# Patient Record
Sex: Female | Born: 1970 | Race: White | Hispanic: No | Marital: Married | Smoking: Current every day smoker
Health system: Southern US, Community
[De-identification: ages and names within clinical notes are randomized; demographics above are authoritative.]

## PROBLEM LIST (undated history)

## (undated) DIAGNOSIS — E785 Hyperlipidemia, unspecified: Secondary | ICD-10-CM

## (undated) DIAGNOSIS — T7840XA Allergy, unspecified, initial encounter: Secondary | ICD-10-CM

## (undated) DIAGNOSIS — K219 Gastro-esophageal reflux disease without esophagitis: Secondary | ICD-10-CM

## (undated) HISTORY — DX: Hyperlipidemia, unspecified: E78.5

## (undated) HISTORY — DX: Gastro-esophageal reflux disease without esophagitis: K21.9

## (undated) HISTORY — DX: Allergy, unspecified, initial encounter: T78.40XA

## (undated) HISTORY — PX: CHOLECYSTECTOMY: SHX55

---

## 1999-09-24 ENCOUNTER — Other Ambulatory Visit: Admission: RE | Admit: 1999-09-24 | Discharge: 1999-09-24 | Payer: Self-pay | Admitting: Obstetrics and Gynecology

## 2000-09-19 ENCOUNTER — Other Ambulatory Visit: Admission: RE | Admit: 2000-09-19 | Discharge: 2000-09-19 | Payer: Self-pay | Admitting: Obstetrics and Gynecology

## 2000-10-14 ENCOUNTER — Observation Stay (HOSPITAL_COMMUNITY): Admission: AD | Admit: 2000-10-14 | Discharge: 2000-10-15 | Payer: Self-pay | Admitting: Obstetrics and Gynecology

## 2002-02-07 ENCOUNTER — Other Ambulatory Visit: Admission: RE | Admit: 2002-02-07 | Discharge: 2002-02-07 | Payer: Self-pay | Admitting: Obstetrics and Gynecology

## 2003-02-13 ENCOUNTER — Other Ambulatory Visit: Admission: RE | Admit: 2003-02-13 | Discharge: 2003-02-13 | Payer: Self-pay | Admitting: Obstetrics and Gynecology

## 2004-03-18 ENCOUNTER — Other Ambulatory Visit: Admission: RE | Admit: 2004-03-18 | Discharge: 2004-03-18 | Payer: Self-pay | Admitting: Obstetrics and Gynecology

## 2009-12-09 ENCOUNTER — Emergency Department (HOSPITAL_COMMUNITY): Admission: EM | Admit: 2009-12-09 | Discharge: 2009-12-09 | Payer: Self-pay | Admitting: Emergency Medicine

## 2009-12-09 IMAGING — CR DG CHEST 2V
2 series · 2 of 2 positions shown · non-contrast
Comparison: None.

CLINICAL DATA: Chest pain on the left.

CHEST - 2 VIEW

[w chest pa]
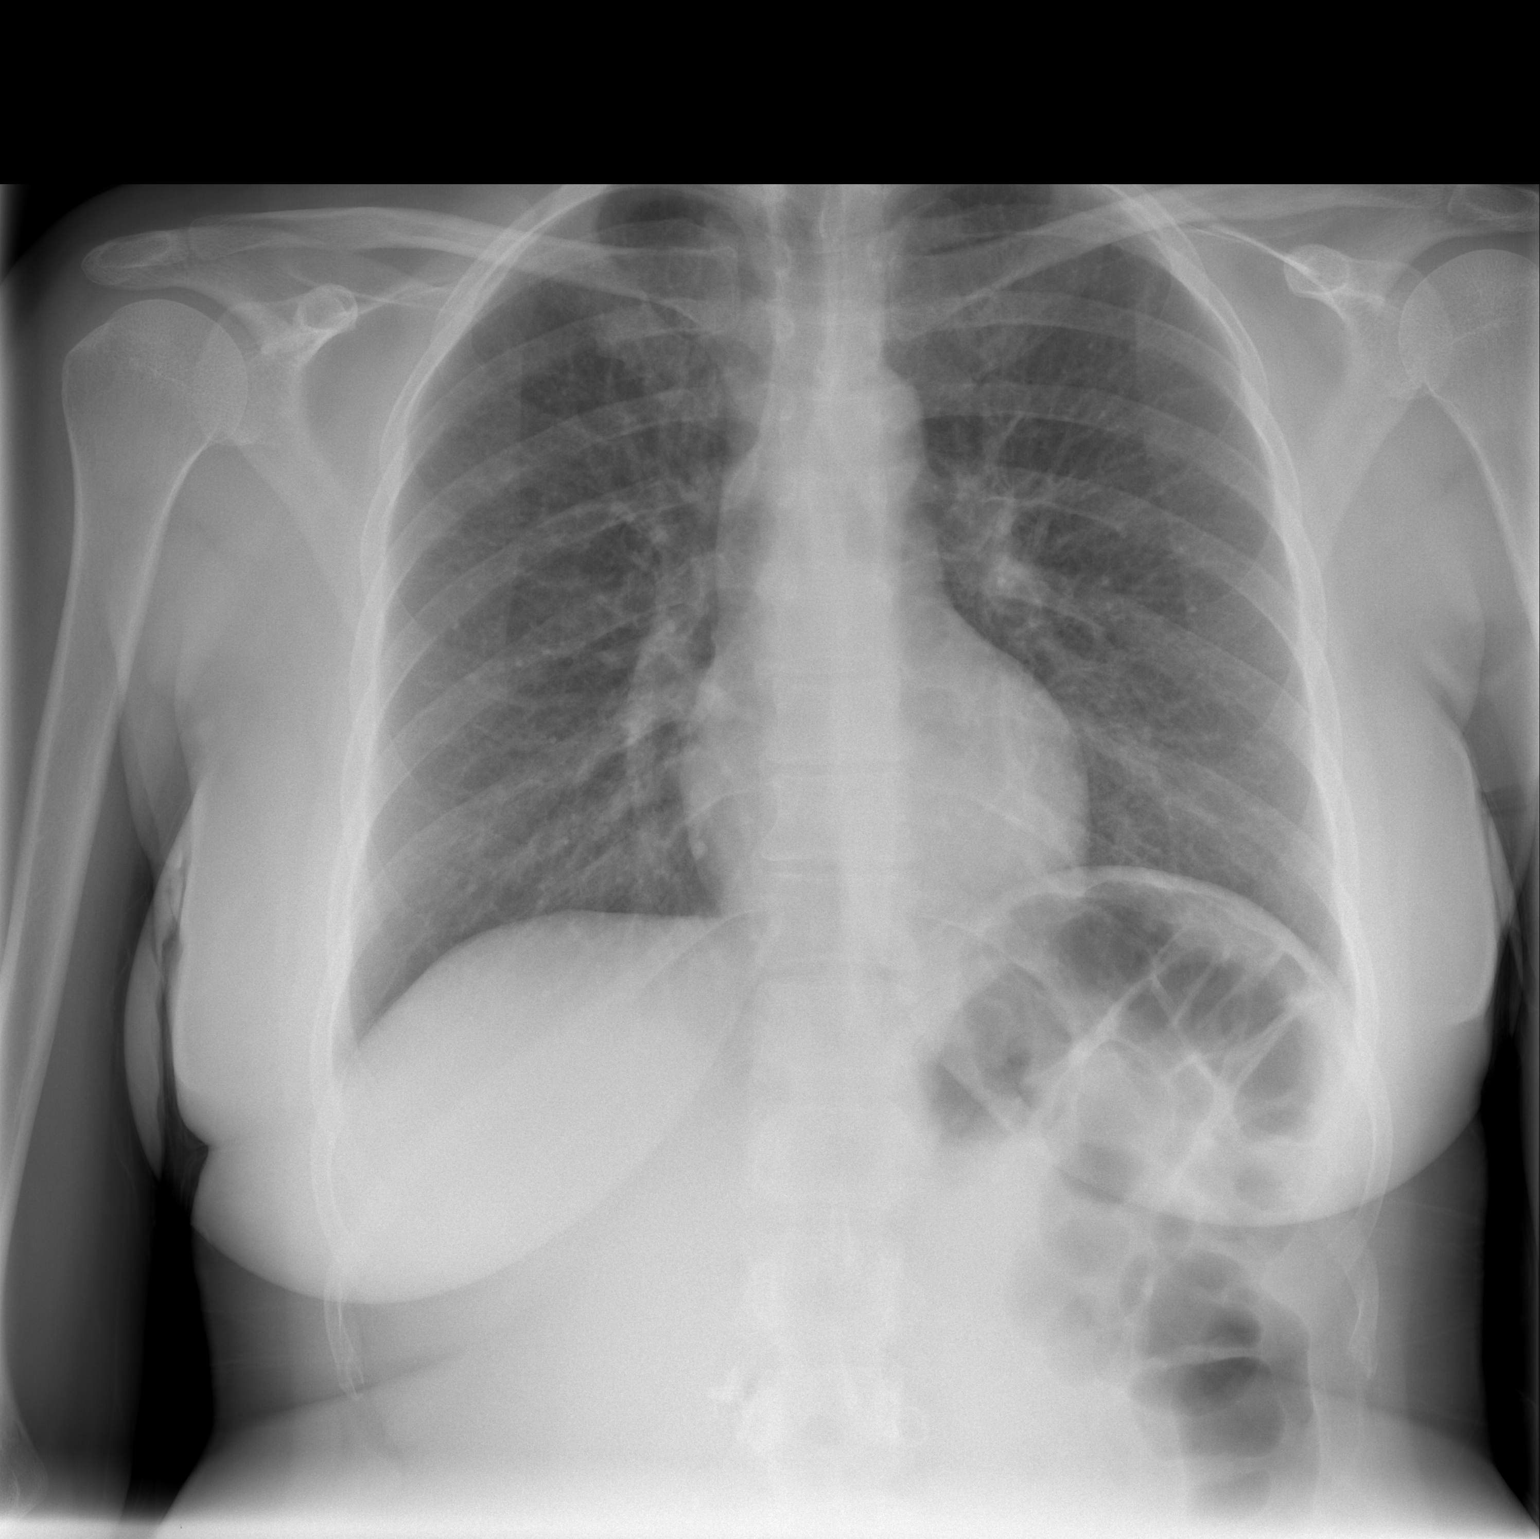

[w chest lat]
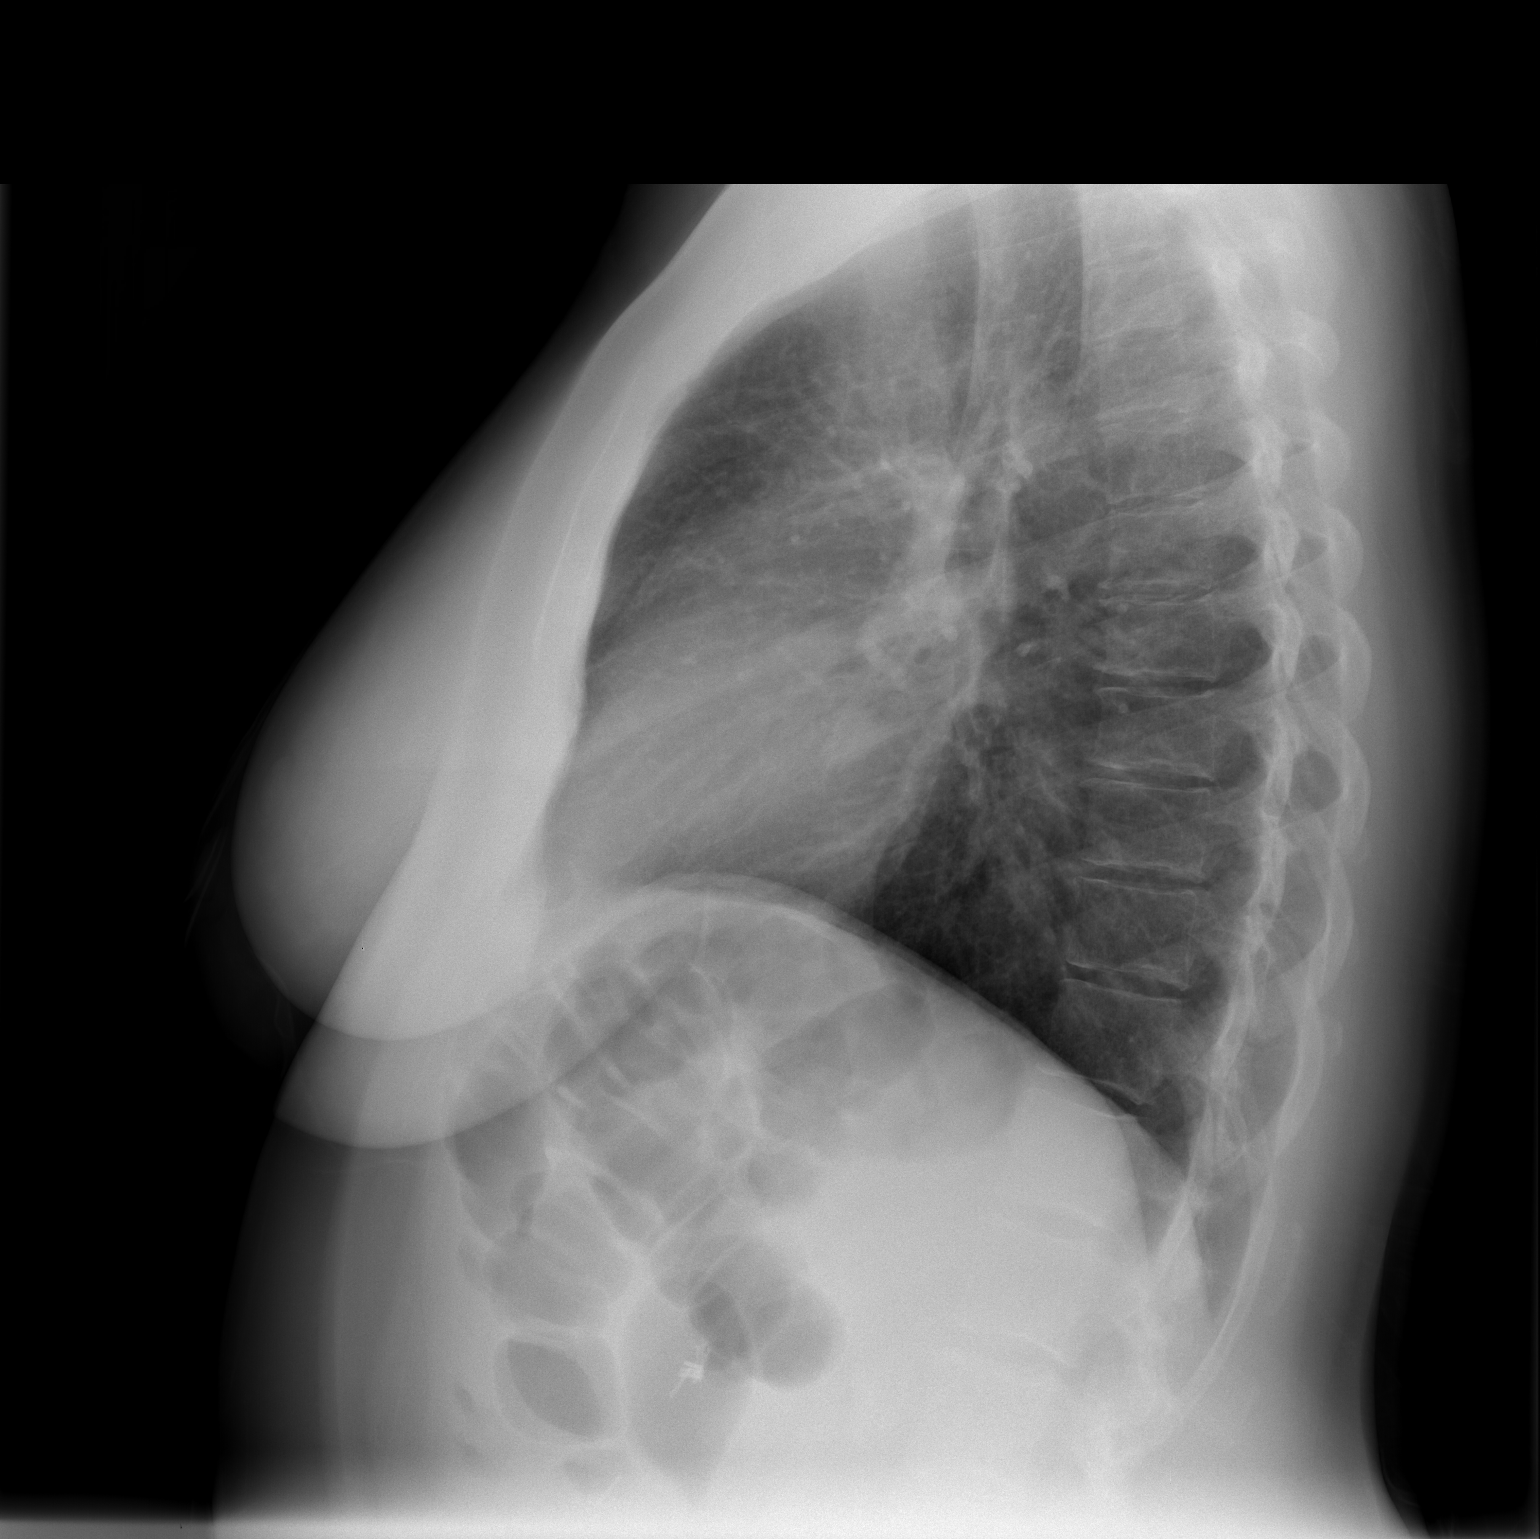

[2 of 2 positions shown; findings below may reference images not displayed]

FINDINGS: Cardiac and mediastinal contours appear normal.

The lungs appear clear.

No pleural effusion is identified.
IMPRESSION: No significant abnormality identified.

## 2011-01-04 LAB — URINALYSIS, ROUTINE W REFLEX MICROSCOPIC
Bilirubin Urine: NEGATIVE
Ketones, ur: NEGATIVE mg/dL
Specific Gravity, Urine: 1.02 (ref 1.005–1.030)
Urobilinogen, UA: 0.2 mg/dL (ref 0.0–1.0)

## 2011-01-04 LAB — POCT I-STAT, CHEM 8
BUN: 5 mg/dL — ABNORMAL LOW (ref 6–23)
Calcium, Ion: 1.11 mmol/L — ABNORMAL LOW (ref 1.12–1.32)
Chloride: 104 mEq/L (ref 96–112)
Glucose, Bld: 204 mg/dL — ABNORMAL HIGH (ref 70–99)
Potassium: 3.7 mEq/L (ref 3.5–5.1)

## 2011-01-04 LAB — PREGNANCY, URINE: Preg Test, Ur: NEGATIVE

## 2011-01-04 LAB — POCT CARDIAC MARKERS
CKMB, poc: <1 ng/mL — ABNORMAL LOW (ref 1.0–8.0)
Troponin i, poc: <0.05 ng/mL (ref 0.00–0.09)

## 2011-01-04 LAB — URINE MICROSCOPIC-ADD ON

## 2011-06-24 ENCOUNTER — Encounter: Payer: Self-pay | Admitting: *Deleted

## 2011-06-24 ENCOUNTER — Encounter: Payer: 59 | Attending: Family Medicine | Admitting: *Deleted

## 2011-06-24 DIAGNOSIS — E119 Type 2 diabetes mellitus without complications: Secondary | ICD-10-CM | POA: Insufficient documentation

## 2011-06-24 DIAGNOSIS — Z713 Dietary counseling and surveillance: Secondary | ICD-10-CM | POA: Insufficient documentation

## 2011-06-24 NOTE — Progress Notes (Signed)
  Medical Nutrition Therapy:  Appt start time: 0900 end time:  1000.   Assessment:  Primary concerns today: diabetes mangement. She and her husband report today for nutrition counseling regarding new onset type II DM. She reports a strong family hx for DM and a hx of GDM 19 years ago. Pt reports that she is very fearful of DM side effects as her father suffered from almost all possible DM complications. She is fearful today but notes that she needs more information on how to care for herself better. She has a referral for a endocrinologist and hopes to go soon.  MEDICATIONS: See updated medication list. Pt only on Januvia for DM.   DIETARY INTAKE:  Usual eating pattern includes 3 meals and 2 snacks per day.  24-hr recall:  B (8:30-9 AM): Honey nut cheerios OR Special K cereal w/ milk  L (11:30-1:30 PM): Tuna sandwich OR [employer]-fil[employer]a Wrap w/ fries Snk (3:30-5 PM): pc. fresh fruit OR peanut butter crackers OR carrots w/ ranch D (7-9 PM): Grilled chicken, grilled vegetables, brown rice OR Steak, baked potato, salad, lt. dressing Snk (9-11 PM): peanut butter crackers OR ice cream OR cheese stick w/ triscuits OR SF cookies Beverages: Mountain Dew (80 oz), Diet Mountain Dew, Diet Coke, or Water w/ Crystal Light  Usual physical activity: Very limited at this time due to stress and limited time w/ children/work.  Estimated energy needs: 1400-1600 calories 180 g carbohydrates 90-100 g protein 55 g fat  Last HgbA1c Level =  ~10% CBG Monitoring = Pt has meter (Accucheck Nano) CBG rates = FBS: 200's, After meals: 240  Progress Towards Goal(s):  In progress.   Nutritional Diagnosis:  [place]-2.1 Inpaired nutrition utilization As related to recent Type 2 DM onset.  As evidenced by A1C levels >10%.    Intervention:  Nutrition education.  Handouts given during visit include:  Living Well with Diabetes: [employer]  ADA's "What's My Number"    Monitoring/Evaluation:  Dietary intake, exercise,  glucose levels, and body weight prn.

## 2011-06-24 NOTE — Patient Instructions (Addendum)
Goals:  Eat 3 meals and 2 snacks, every 3-5 hrs  Limit carbohydrate intake to 30-45 grams carbohydrate/meal  Limit carbohydrate intake to 15-20 grams carbohydrate/snack  Add lean protein foods to meals/snacks  Monitor glucose levels as instructed by your doctor  Aim for 30-45 mins of physical activity daily  Bring food record and glucose log to your next nutrition visit

## 2011-07-09 ENCOUNTER — Other Ambulatory Visit: Payer: Self-pay | Admitting: Family Medicine

## 2011-07-09 DIAGNOSIS — Z1231 Encounter for screening mammogram for malignant neoplasm of breast: Secondary | ICD-10-CM

## 2011-07-15 ENCOUNTER — Ambulatory Visit: Payer: 59

## 2011-07-21 ENCOUNTER — Ambulatory Visit: Payer: 59

## 2011-08-24 ENCOUNTER — Ambulatory Visit: Payer: 59 | Admitting: *Deleted

## 2013-03-23 ENCOUNTER — Ambulatory Visit (INDEPENDENT_AMBULATORY_CARE_PROVIDER_SITE_OTHER): Payer: BC Managed Care – PPO | Admitting: Family Medicine

## 2013-03-23 VITALS — BP 112/82 | HR 77 | Temp 98.0°F | Resp 18 | Wt 180.0 lb

## 2013-03-23 DIAGNOSIS — J029 Acute pharyngitis, unspecified: Secondary | ICD-10-CM

## 2013-03-23 DIAGNOSIS — J019 Acute sinusitis, unspecified: Secondary | ICD-10-CM

## 2013-03-23 LAB — POCT CBC
Granulocyte percent: 76.5 % (ref 37–80)
HCT, POC: 45.4 % (ref 37.7–47.9)
Hemoglobin: 14.6 g/dL (ref 12.2–16.2)
Lymph, poc: 1.8 (ref 0.6–3.4)
MCH, POC: 31.3 pg — AB (ref 27–31.2)
MCHC: 32.2 g/dL (ref 31.8–35.4)
MCV: 91.2 fL (ref 80–97)
MID (cbc): 0.6 (ref 0–0.9)
MPV: 9.2 fL (ref 0–99.8)
POC Granulocyte: 8 — AB (ref 2–6.9)
POC LYMPH PERCENT: 17.4 %L (ref 10–50)
POC MID %: 6.1 % (ref 0–12)
Platelet Count, POC: 256 10*3/uL (ref 142–424)
RBC: 4.67 M/uL (ref 4.04–5.48)
RDW, POC: 13.5 %
WBC: 10.5 10*3/uL — AB (ref 4.6–10.2)

## 2013-03-23 LAB — POCT RAPID STREP A (OFFICE): Rapid Strep A Screen: NEGATIVE

## 2013-03-23 MED ORDER — FLUCONAZOLE 150 MG PO TABS: 150.0000 mg | ORAL_TABLET | Freq: Once | ORAL | Status: AC

## 2013-03-23 MED ORDER — AZITHROMYCIN 250 MG PO TABS: ORAL_TABLET | ORAL | Status: AC

## 2013-03-23 NOTE — Progress Notes (Signed)
[facility]:  Office Visit  Chief Complaint:  Chief Complaint  Patient presents with  . Sore Throat  . Fever    HPI: Monique Mitchell is a 42 y.o. female who complains of  Sore throat, chills x 3 days. Sinus tenderness, allergies, + chills, no fevers, no sob, wheeze, no CP. + left ear pain. Has not tried anything otc for it + smoker , + diabetes  Past Medical History  Diagnosis Date  . GERD (gastroesophageal reflux disease)   . Hyperlipidemia   . Diabetes mellitus   . Allergy    Past Surgical History  Procedure Laterality Date  . Cholecystectomy     History   Social History  . Marital Status: Married    Spouse Name: N/A    Number of Children: N/A  . Years of Education: N/A   Social History Main Topics  . Smoking status: Current Every Day Smoker -- 2.00 packs/day for 9 years  . Smokeless tobacco: None  . Alcohol Use: Yes  . Drug Use: No  . Sexually Active: Yes    Birth Control/ Protection: IUD   Other Topics Concern  . None   Social History Narrative  . None   Family History  Problem Relation Age of Onset  . Diabetes Father   . Heart disease Father    Allergies  Allergen Reactions  . Cephalexin   . Cephalexin   . Metformin And Related    Prior to Admission medications   Medication Sig Start Date End Date Taking? Authorizing Provider  fexofenadine (ALLEGRA) 180 MG tablet Take 180 mg by mouth daily.     Yes Historical Provider, MD  insulin detemir (LEVEMIR) 100 UNIT/ML injection Inject 17 Units into the skin at bedtime.   Yes Historical Provider, MD  insulin lispro (HUMALOG) 100 UNIT/ML injection Inject into the skin 3 (three) times daily before meals.   Yes Historical Provider, MD  lansoprazole (PREVACID) 15 MG capsule Take 15 mg by mouth daily.     Yes Historical Provider, MD  lisinopril (PRINIVIL,ZESTRIL) 10 MG tablet Take 5 mg by mouth daily.    Yes Historical Provider, MD  fenofibrate micronized (LOFIBRA) 134 MG capsule Take 134 mg  by mouth daily before breakfast.      Historical Provider, MD  sitaGLIPtin (JANUVIA) 50 MG tablet Take 50 mg by mouth daily.      Historical Provider, MD     ROS: The patient denies night sweats, unintentional weight loss, chest pain, palpitations, wheezing, dyspnea on exertion, nausea, vomiting, abdominal pain, dysuria, hematuria, melena, numbness, weakness, or tingling.   All other systems have been reviewed and were otherwise negative with the exception of those mentioned in the HPI and as above.    PHYSICAL EXAM: Filed Vitals:   03/23/13 1053  BP: 112/82  Pulse: 77  Temp: 98 F (36.7 C)  Resp: 18   Filed Vitals:   03/23/13 1053  Weight: 180 lb (81.647 kg)   Body mass index is 28.19 kg/(m^2).  General: Alert, no acute distress HEENT:  Normocephalic, atraumatic, oropharynx patent. + erythematous. No exudates, + adenoid swelling, + sinus tenderness Cardiovascular:  Regular rate and rhythm, no rubs murmurs or gallops.  No Carotid bruits, radial pulse intact. No pedal edema.  Respiratory: Clear to auscultation bilaterally.  No wheezes, rales, or rhonchi.  No cyanosis, no use of accessory musculature GI: No organomegaly, abdomen is soft and non-tender, positive bowel sounds.  No masses. Skin: No rashes. Neurologic: Facial  musculature symmetric. Psychiatric: Patient is appropriate throughout our interaction. Lymphatic: No cervical lymphadenopathy Musculoskeletal: Gait intact.   LABS: Results for orders placed in visit on 03/23/13  POCT CBC      Result Value Range   WBC 10.5 (*) 4.6 - 10.2 K/uL   Lymph, poc 1.8  0.6 - 3.4   POC LYMPH PERCENT 17.4  10 - 50 %L   MID (cbc) 0.6  0 - 0.9   POC MID % 6.1  0 - 12 %M   POC Granulocyte 8.0 (*) 2 - 6.9   Granulocyte percent 76.5  37 - 80 %G   RBC 4.67  4.04 - 5.48 M/uL   Hemoglobin 14.6  12.2 - 16.2 g/dL   HCT, POC 96.0  45.4 - 47.9 %   MCV 91.2  80 - 97 fL   MCH, POC 31.3 (*) 27 - 31.2 pg   MCHC 32.2  31.8 - 35.4 g/dL   RDW,  POC 09.8     Platelet Count, POC 256  142 - 424 K/uL   MPV 9.2  0 - 99.8 fL  POCT RAPID STREP A (OFFICE)      Result Value Range   Rapid Strep A Screen Negative  Negative     EKG/XRAY:   Primary read interpreted by Dr. Conley Rolls at [facility].   ASSESSMENT/PLAN: Encounter Diagnoses  Name Primary?  . Acute pharyngitis Yes  . Acute sinusitis    Rx Azithromycin Rx Diflucan F/u prn    LE, THAO PHUONG, DO 03/25/2013 4:11 PM

## 2014-02-26 ENCOUNTER — Other Ambulatory Visit: Payer: Self-pay | Admitting: Gastroenterology

## 2014-02-26 DIAGNOSIS — R1013 Epigastric pain: Secondary | ICD-10-CM

## 2014-03-12 ENCOUNTER — Ambulatory Visit (HOSPITAL_COMMUNITY)
Admission: RE | Admit: 2014-03-12 | Discharge: 2014-03-12 | Disposition: A | Discharge status: Home / Self Care | Payer: BC Managed Care – PPO | Source: Ambulatory Visit | Attending: Gastroenterology | Admitting: Gastroenterology

## 2014-03-12 DIAGNOSIS — R143 Flatulence: Secondary | ICD-10-CM

## 2014-03-12 DIAGNOSIS — R1013 Epigastric pain: Secondary | ICD-10-CM

## 2014-03-12 DIAGNOSIS — R109 Unspecified abdominal pain: Secondary | ICD-10-CM | POA: Insufficient documentation

## 2014-03-12 DIAGNOSIS — R142 Eructation: Secondary | ICD-10-CM

## 2014-03-12 DIAGNOSIS — R141 Gas pain: Secondary | ICD-10-CM | POA: Insufficient documentation

## 2014-03-12 MED ORDER — TECHNETIUM TC 99M SULFUR COLLOID
2.0000 | Freq: Once | INTRAVENOUS | Status: AC | PRN
  Administered 2014-03-12: 2 via ORAL

## 2018-09-19 ENCOUNTER — Telehealth (HOSPITAL_COMMUNITY): Payer: Self-pay | Admitting: *Deleted

## 2018-09-19 NOTE — Telephone Encounter (Signed)
Referral received from [facility].  Pt is s/p 09/10/18 Stemi and DES LAD.  Reviewed hospital course in care everywhere.  Pt has upcoming follow up appt on 12/13 with Cardiology and 10/23/18 with endocrinology.  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 Pt most recent HgA1C was 12.0.  Pt seen for initial consult with endocrinology and medications were changed.  Would like to see much improve blood glucose readings( less than 300) so that pt would be able to participate in CR. Will have support staff verify insurance, send MD order, request 12 lead ekg tracing and make initial call for review of the referral process.  Continue to follow pt for readiness to proceed with group exercise. Alanson Alyarlette Malikhi Ogan RN, BSN Cardiac and Emergency planning/management officerulmonary Rehab Nurse Navigator

## 2018-09-20 ENCOUNTER — Telehealth (HOSPITAL_COMMUNITY): Payer: Self-pay

## 2018-09-20 NOTE — Telephone Encounter (Signed)
Pt insurance is active and benefits verified through [place]. Co-pay $0.00, DED $3,100.00/$2,175.26 met, out of pocket $7,000.00/$2,694.48 met, co-insurance 10%. No pre-authorization required. Passport, 09/20/18 @ 4:08PM, REF# [id number]  Will contact patient to see if she is interested in the Cardiac Rehab Program. If interested, patient will need to complete follow up appt. Once completed, patient will be contacted for scheduling upon review by the RN Navigator.

## 2018-10-31 ENCOUNTER — Telehealth (HOSPITAL_COMMUNITY): Payer: Self-pay

## 2018-10-31 NOTE — Telephone Encounter (Signed)
Attempted to call patient in regards to Cardiac Rehab - LM on VM 

## 2018-11-17 ENCOUNTER — Encounter (HOSPITAL_COMMUNITY): Payer: Self-pay

## 2018-11-17 NOTE — Telephone Encounter (Signed)
Attempted to contact pt in regards to CR, unable to leave voicemail.  Mailed letter. 

## 2018-11-28 NOTE — Telephone Encounter (Signed)
3rd Attempted to call patient in regards to Cardiac Rehab - LM on VM °Gloria W. Support Rep II °

## 2018-12-11 ENCOUNTER — Telehealth (HOSPITAL_COMMUNITY): Payer: Self-pay

## 2018-12-11 NOTE — Telephone Encounter (Signed)
No response from pt °Closed referral °Gloria W. Support Rep II °
